# Patient Record
Sex: Male | Born: 1970 | Race: White | Hispanic: No | Marital: Married | Smoking: Never smoker
Health system: Southern US, Community
[De-identification: ages and names within clinical notes are randomized; demographics above are authoritative.]

## PROBLEM LIST (undated history)

## (undated) DIAGNOSIS — C801 Malignant (primary) neoplasm, unspecified: Secondary | ICD-10-CM

## (undated) DIAGNOSIS — E785 Hyperlipidemia, unspecified: Secondary | ICD-10-CM

## (undated) HISTORY — DX: Malignant (primary) neoplasm, unspecified: C80.1

## (undated) HISTORY — DX: Hyperlipidemia, unspecified: E78.5

---

## 2014-06-25 ENCOUNTER — Ambulatory Visit (HOSPITAL_COMMUNITY): Payer: BLUE CROSS/BLUE SHIELD | Attending: Internal Medicine | Admitting: Cardiology

## 2014-06-25 ENCOUNTER — Other Ambulatory Visit (HOSPITAL_COMMUNITY): Payer: Self-pay | Admitting: Internal Medicine

## 2014-06-25 DIAGNOSIS — I493 Ventricular premature depolarization: Secondary | ICD-10-CM

## 2014-06-25 DIAGNOSIS — R9431 Abnormal electrocardiogram [ECG] [EKG]: Secondary | ICD-10-CM

## 2014-06-25 NOTE — Progress Notes (Signed)
Echo performed. 

## 2014-06-29 ENCOUNTER — Telehealth: Payer: Self-pay | Admitting: *Deleted

## 2014-06-29 DIAGNOSIS — I493 Ventricular premature depolarization: Secondary | ICD-10-CM

## 2014-06-29 NOTE — Telephone Encounter (Signed)
Order placed for 24 hour Holter Monitor (Dr. Fransico Him) for 07/06/14 @ 0830.

## 2014-07-06 ENCOUNTER — Encounter (INDEPENDENT_AMBULATORY_CARE_PROVIDER_SITE_OTHER): Payer: BLUE CROSS/BLUE SHIELD

## 2014-07-06 ENCOUNTER — Encounter: Payer: Self-pay | Admitting: Radiology

## 2014-07-06 DIAGNOSIS — I493 Ventricular premature depolarization: Secondary | ICD-10-CM | POA: Diagnosis not present

## 2014-07-06 NOTE — Progress Notes (Signed)
Patient ID: Terry Nichols, male   DOB: 1970/09/24, 44 y.o.   MRN: [id number] E cardio 24hr holter applied

## 2016-04-15 ENCOUNTER — Other Ambulatory Visit: Payer: Self-pay | Admitting: Sports Medicine

## 2016-04-15 DIAGNOSIS — M25511 Pain in right shoulder: Secondary | ICD-10-CM

## 2016-04-23 ENCOUNTER — Ambulatory Visit
Admission: RE | Admit: 2016-04-23 | Discharge: 2016-04-23 | Disposition: A | Discharge status: Home / Self Care | Payer: 59 | Source: Ambulatory Visit | Attending: Sports Medicine | Admitting: Sports Medicine

## 2016-04-23 DIAGNOSIS — M25511 Pain in right shoulder: Secondary | ICD-10-CM

## 2016-06-25 HISTORY — PX: OTHER SURGICAL HISTORY: SHX169

## 2017-08-26 ENCOUNTER — Ambulatory Visit: Payer: 59 | Admitting: Sports Medicine

## 2017-08-30 ENCOUNTER — Encounter: Payer: Self-pay | Admitting: Family Medicine

## 2017-08-30 ENCOUNTER — Ambulatory Visit: Payer: Self-pay

## 2017-08-30 ENCOUNTER — Ambulatory Visit: Payer: Managed Care, Other (non HMO) | Admitting: Family Medicine

## 2017-08-30 ENCOUNTER — Ambulatory Visit: Payer: Self-pay | Admitting: Sports Medicine

## 2017-08-30 VITALS — BP 141/74 | HR 67 | Wt 211.0 lb

## 2017-08-30 DIAGNOSIS — M25561 Pain in right knee: Secondary | ICD-10-CM | POA: Insufficient documentation

## 2017-08-30 MED ORDER — NITROGLYCERIN 0.2 MG/HR TD PT24: MEDICATED_PATCH | TRANSDERMAL | 11 refills | Status: DC

## 2017-08-30 NOTE — Progress Notes (Signed)
Terry Nichols - 47 y.o. male MRN [id number]  Date of birth: [birth year]  SUBJECTIVE:  Including CC & ROS.  Chief Complaint  Patient presents with  . Knee Pain    Terry Nichols is a 47 y.o. male that is presenting with right knee pain. Pain has been ongoing for six months. Pain is located medial aspect. He feels a tightness. Pain is described as a sharp pain with activity. Pain is mild to severe upon flexion. Denies swelling. He plays tennis once a week and basketball and he has had to limit his activity. He had an injection previously in January with no improvement.   Review of Systems  Constitutional: Negative for fever.  HENT: Negative for congestion.   Respiratory: Negative for cough.   Cardiovascular: Negative for chest pain.  Gastrointestinal: Negative for abdominal pain.  Musculoskeletal: Negative for gait problem.  Skin: Negative for color change.  Neurological: Negative for weakness.  Hematological: Negative for adenopathy.  Psychiatric/Behavioral: Negative for agitation.    HISTORY: Past Medical, Surgical, Social, and Family History Reviewed & Updated per EMR.   Pertinent Historical Findings include:  History reviewed. No pertinent past medical history.  Past Surgical History:  Procedure Laterality Date  . Right shoulder surgery Right 06/2016    No Known Allergies  History reviewed. No pertinent family history.   Social History   Socioeconomic History  . Marital status: Married    Spouse name: Not on file  . Number of children: Not on file  . Years of education: Not on file  . Highest education level: Not on file  Occupational History  . Not on file  Social Needs  . Financial resource strain: Not on file  . Food insecurity:    Worry: Not on file    Inability: Not on file  . Transportation needs:    Medical: Not on file    Non-medical: Not on file  Tobacco Use  . Smoking status: Never Smoker  . Smokeless tobacco: Never Used  Substance and Sexual  Activity  . Alcohol use: Never    Frequency: Never  . Drug use: Never  . Sexual activity: Not on file  Lifestyle  . Physical activity:    Days per week: Not on file    Minutes per session: Not on file  . Stress: Not on file  Relationships  . Social connections:    Talks on phone: Not on file    Gets together: Not on file    Attends religious service: Not on file    Active member of club or organization: Not on file    Attends meetings of clubs or organizations: Not on file    Relationship status: Not on file  . Intimate partner violence:    Fear of current or ex partner: Not on file    Emotionally abused: Not on file    Physically abused: Not on file    Forced sexual activity: Not on file  Other Topics Concern  . Not on file  Social History Narrative  . Not on file     PHYSICAL EXAM:  VS: BP (!) 141/74 (BP Location: Left Arm, Patient Position: Sitting, Cuff Size: Normal)   Pulse 67   Wt 211 lb (95.7 kg)   SpO2 97%  Physical Exam Gen: NAD, alert, cooperative with exam, well-appearing ENT: normal lips, normal nasal mucosa,  Eye: normal EOM, normal conjunctiva and lids CV:  no edema, +2 pedal pulses   Resp: no accessory muscle use,  non-labored,  Skin: no rashes, no areas of induration  Neuro: normal tone, normal sensation to touch Psych:  normal insight, alert and oriented MSK:  Right Knee: Normal to inspection with no erythema or effusion or obvious bony abnormalities. Palpation normal with no warmth, joint line tenderness, patellar tenderness, or condyle tenderness. ROM full in flexion and extension and lower leg rotation. Ligaments with solid consistent endpoints including LCL, MCL. Mild pain with [place]ss[place] tests. Non painful patellar compression. Patellar glide without crepitus. Patellar and quadriceps tendons unremarkable. Hamstring and quadriceps strength is normal.  Neurovascularly intact   Limited ultrasound: right knee:  No effusion in SPP  Normal  appearing QT and PT  Outpouching of medial meniscus Normal appearing lateral meniscus   Summary: degenerative meniscal changes of medial meniscus   Ultrasound and interpretation by Clearance Coots, MD    ASSESSMENT & PLAN:   Acute pain of right knee Pain likely to irritation of the medial meniscus. No effusion.  - nitro patches  - compression and ice  - counseled on exercise  - if no improvement consider PT vs injection (CSI vs PRP)

## 2017-08-30 NOTE — Assessment & Plan Note (Signed)
Pain likely to irritation of the medial meniscus. No effusion.  - nitro patches  - compression and ice  - counseled on exercise  - if no improvement consider PT vs injection (CSI vs PRP)

## 2017-08-30 NOTE — Patient Instructions (Signed)
Nitroglycerin Protocol   Apply 1/4 nitroglycerin patch to affected area daily.  Change position of patch within the affected area every 24 hours.  You may experience a headache during the first 1-2 weeks of using the patch, these should subside.  If you experience headaches after beginning nitroglycerin patch treatment, you may take your preferred over the counter pain reliever.  Another side effect of the nitroglycerin patch is skin irritation or rash related to patch adhesive.  Please notify our office if you develop more severe headaches or rash, and stop the patch.  Tendon healing with nitroglycerin patch may require 12 to 24 weeks depending on the extent of injury.  Men should not use if taking Viagra, Cialis, or Levitra.   Do not use if you have migraines or rosacea.   Please try to ice the knee and use tylenol  Follow up with me in 4 weeks if your symptoms have not improved.

## 2017-11-30 ENCOUNTER — Other Ambulatory Visit: Payer: Self-pay | Admitting: Orthopedic Surgery

## 2017-11-30 DIAGNOSIS — M25561 Pain in right knee: Secondary | ICD-10-CM

## 2017-12-03 ENCOUNTER — Ambulatory Visit
Admission: RE | Admit: 2017-12-03 | Discharge: 2017-12-03 | Disposition: A | Discharge status: Home / Self Care | Payer: Managed Care, Other (non HMO) | Source: Ambulatory Visit | Attending: Orthopedic Surgery | Admitting: Orthopedic Surgery

## 2017-12-03 DIAGNOSIS — M25561 Pain in right knee: Secondary | ICD-10-CM

## 2018-02-22 ENCOUNTER — Other Ambulatory Visit: Payer: Self-pay | Admitting: Internal Medicine

## 2018-02-22 DIAGNOSIS — E785 Hyperlipidemia, unspecified: Secondary | ICD-10-CM

## 2019-03-02 ENCOUNTER — Other Ambulatory Visit: Payer: Self-pay | Admitting: Internal Medicine

## 2019-03-02 DIAGNOSIS — R9431 Abnormal electrocardiogram [ECG] [EKG]: Secondary | ICD-10-CM

## 2019-03-02 DIAGNOSIS — E785 Hyperlipidemia, unspecified: Secondary | ICD-10-CM

## 2019-03-16 ENCOUNTER — Ambulatory Visit
Admission: RE | Admit: 2019-03-16 | Discharge: 2019-03-16 | Disposition: A | Discharge status: Home / Self Care | Payer: Self-pay | Source: Ambulatory Visit | Attending: Internal Medicine | Admitting: Internal Medicine

## 2019-03-16 DIAGNOSIS — R9431 Abnormal electrocardiogram [ECG] [EKG]: Secondary | ICD-10-CM

## 2019-03-16 DIAGNOSIS — E785 Hyperlipidemia, unspecified: Secondary | ICD-10-CM

## 2019-05-07 IMAGING — MR MR KNEE*R* W/O CM
4 of 7 series · 19 of 40 positions shown · non-contrast
Comparison: None.

CLINICAL DATA: Right knee pain for 8 months.  No known injury.

EXAM:
MRI OF THE RIGHT KNEE WITHOUT CONTRAST
TECHNIQUE: Multiplanar, multisequence MR imaging of the knee was performed. No
intravenous contrast was administered.

[Series 3: T2 fat-sat · axial · 4.0mm · 0.33mm/px · z∈[-30,+75]mm · 4 of 30 slices shown]
[im 1/30]
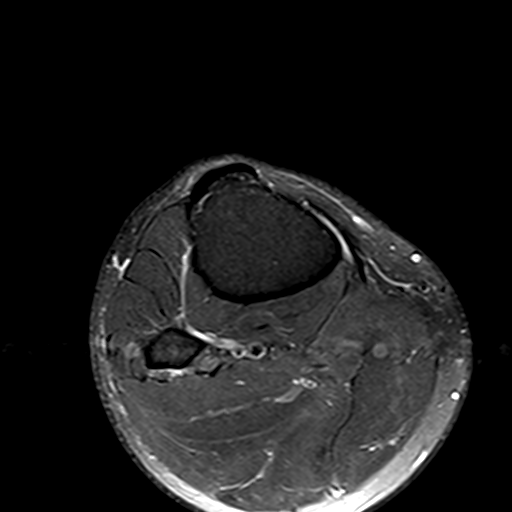
[im 5/30]
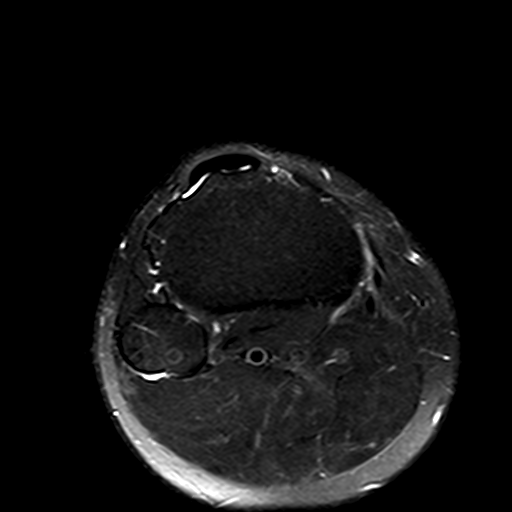
[im 15/30]
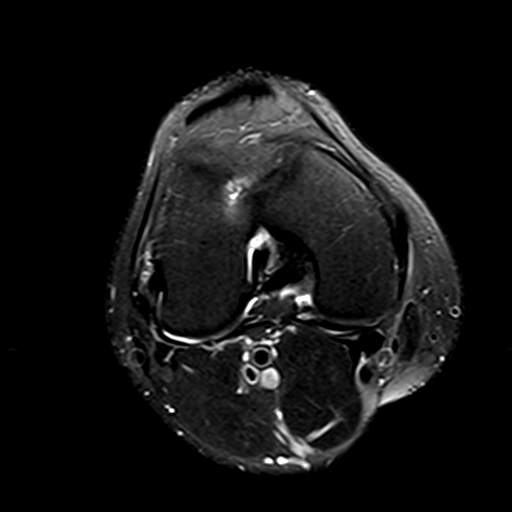
[im 25/30]
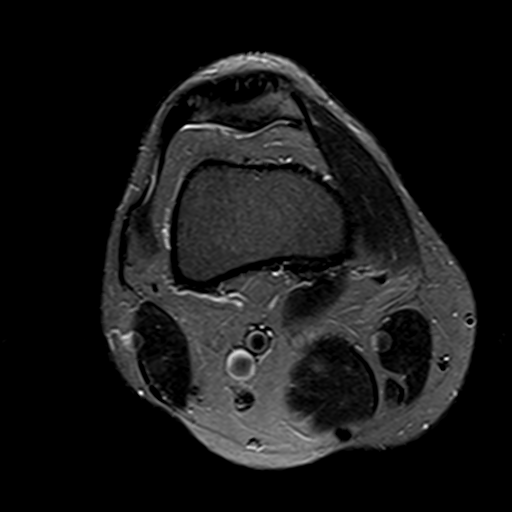

[Series 4: PD fat-sat · sagittal · 3.0mm · 0.29mm/px · 6 of 30 slices shown (1 of 3)]
[im 1/30]
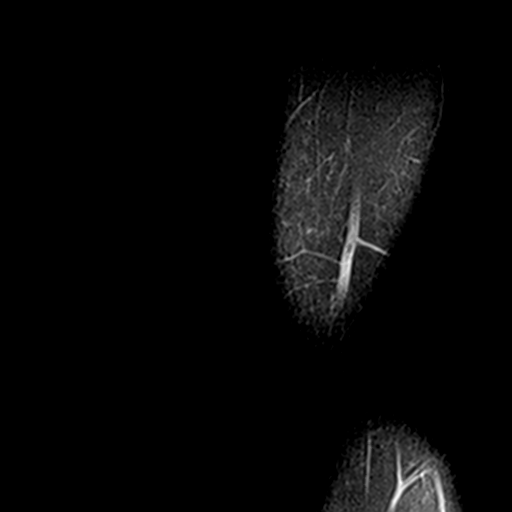
[im 6/30]
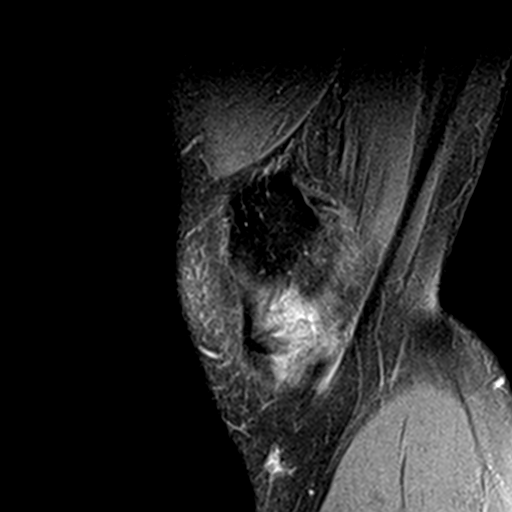
[im 12/30]
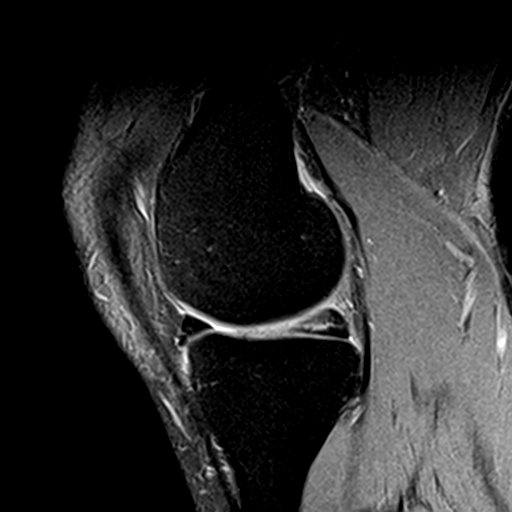
[im 18/30]
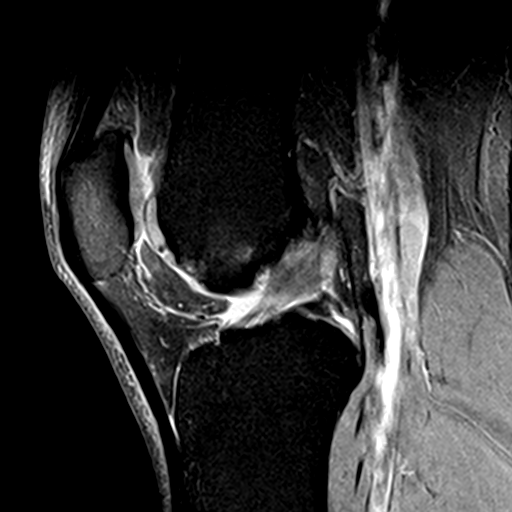
[im 24/30]
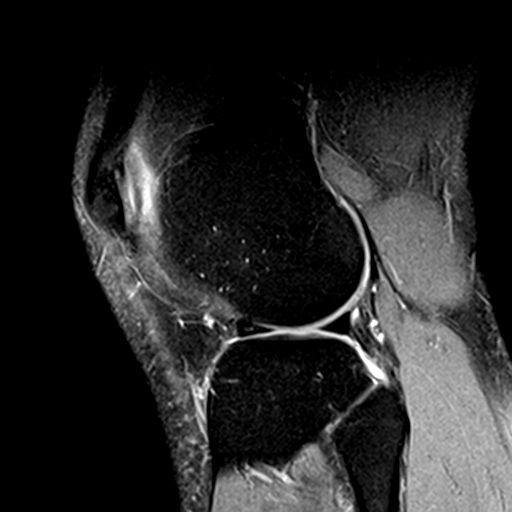
[im 30/30]
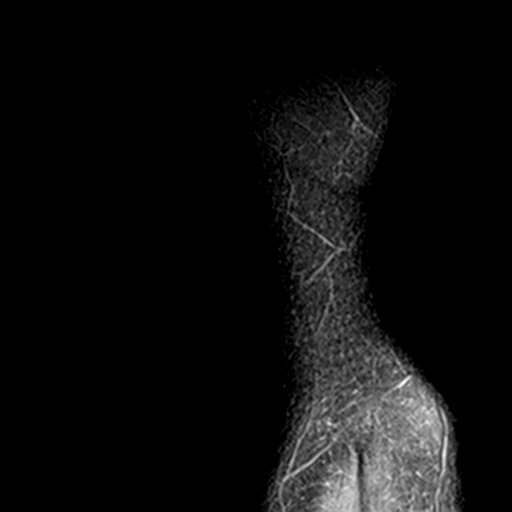

[Series 7: PD fat-sat · coronal · 4.0mm · 0.29mm/px · 6 of 32 slices shown (2 of 3)]
[im 1/32]
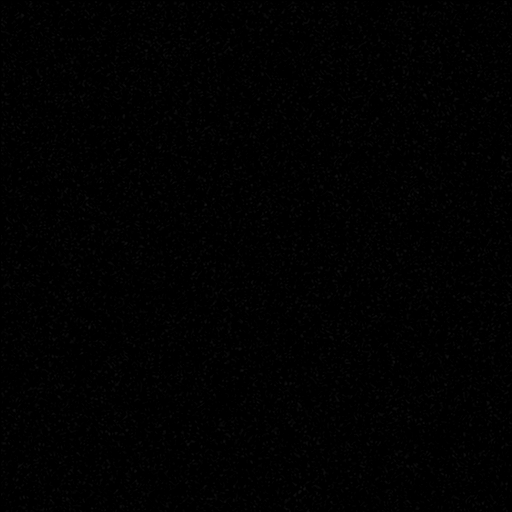
[im 7/32]
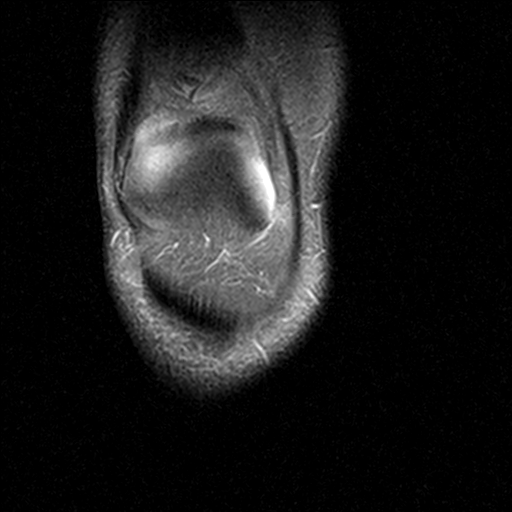
[im 13/32]
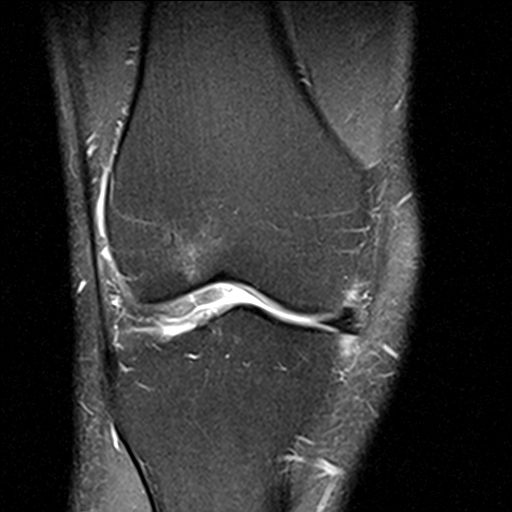
[im 19/32]
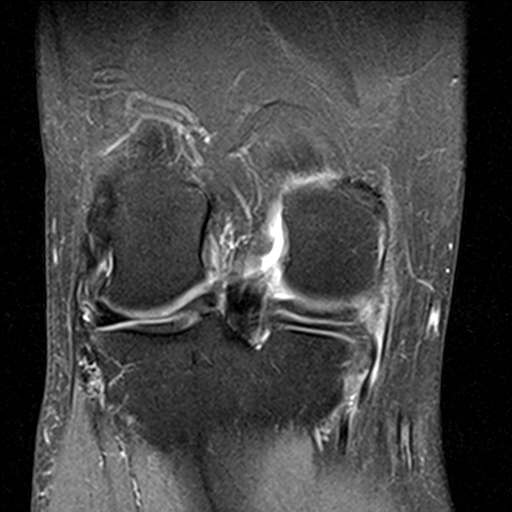
[im 25/32]
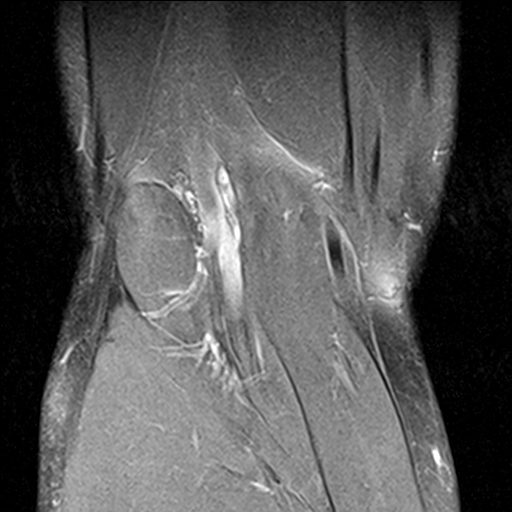
[im 32/32]
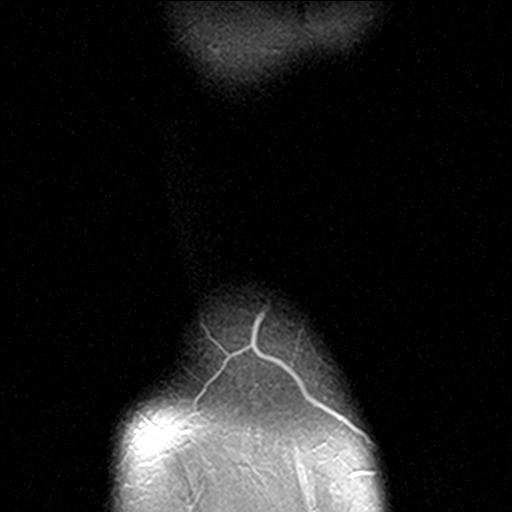

[Series 8: PD fat-sat · oblique · 2.5mm · 0.29mm/px · 3 of 14 slices shown (3 of 3)]
[im 1/14]
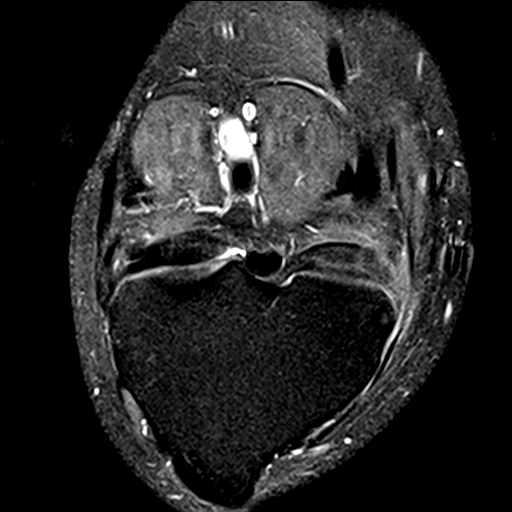
[im 7/14]
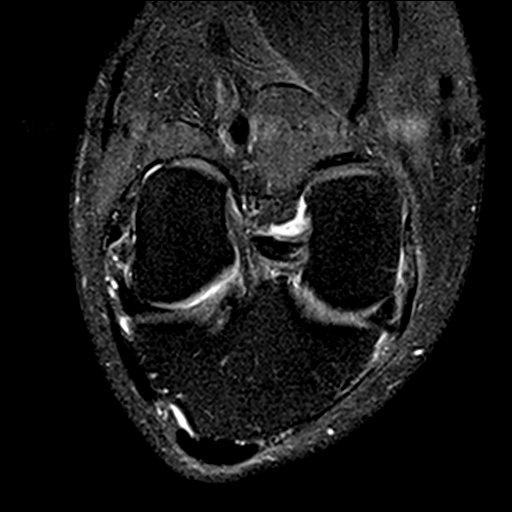
[im 14/14]
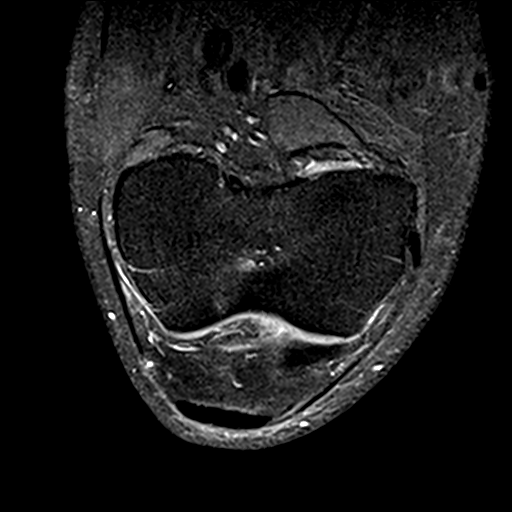

[19 of 40 positions shown; findings below may reference images not displayed]

FINDINGS: MENISCI

Medial meniscus: Complex tear of the posterior horn-body junction of
the medial meniscus extending into the posterior horn.

Lateral meniscus:  Intact.

LIGAMENTS

Cruciates:  Intact ACL and PCL.

Collaterals: Medial collateral ligament is intact. Lateral
collateral ligament complex is intact.

CARTILAGE

Patellofemoral: Focal 8 mm chondral defect involving the lateral
trochlea with subchondral cystic changes and marrow edema.

Medial: Partial-thickness cartilage loss of the medial femorotibial
compartment.

Lateral:  No chondral defect.

Joint: No joint effusion. Normal Hoffa's fat. No plical thickening.

Popliteal Fossa:  No Baker cyst. Intact popliteus tendon.

Extensor Mechanism: Intact quadriceps tendon. Intact patellar
tendon. Intact medial patellar retinaculum. Intact lateral patellar
retinaculum. Intact MPFL.

Bones: No other focal marrow signal abnormality. No fracture or
dislocation.

Other: No fluid collection or hematoma.  Normal muscles.
IMPRESSION: 1. Complex tear of the posterior horn-body junction of the medial
meniscus extending into the posterior horn.
2. Focal 8 mm chondral defect involving the lateral trochlea with
subchondral cystic changes and marrow edema.
3. Partial-thickness cartilage loss of the medial femorotibial
compartment.

## 2020-02-28 ENCOUNTER — Telehealth (HOSPITAL_COMMUNITY): Payer: Self-pay

## 2020-02-28 NOTE — Telephone Encounter (Signed)
Called to Discuss with patient about Covid symptoms and the use of the monoclonal antibody infusion for those with mild to moderate Covid symptoms and at a high risk of hospitalization.     Pt appears to qualify for this infusion due to co-morbid conditions and/or a member of an at-risk group in accordance with the FDA Emergency Use Authorization.    Unable to reach pt, left voicemail requesting call back to hotline number.   Mandrell Vangilder Lorita Officer, RN

## 2020-06-17 ENCOUNTER — Encounter: Payer: Self-pay | Admitting: Gastroenterology

## 2020-06-21 ENCOUNTER — Ambulatory Visit (AMBULATORY_SURGERY_CENTER): Payer: Managed Care, Other (non HMO)

## 2020-06-21 ENCOUNTER — Other Ambulatory Visit: Payer: Self-pay

## 2020-06-21 VITALS — Ht 74.0 in | Wt 200.0 lb

## 2020-06-21 DIAGNOSIS — Z1211 Encounter for screening for malignant neoplasm of colon: Secondary | ICD-10-CM

## 2020-06-21 MED ORDER — NA SULFATE-K SULFATE-MG SULF 17.5-3.13-1.6 GM/177ML PO SOLN: 1.0000 | Freq: Once | ORAL | 0 refills | Status: AC

## 2020-06-21 NOTE — Progress Notes (Signed)
No egg or soy allergy known to patient  No issues with past sedation with any surgeries or procedures No intubation problems in the past  No FH of Malignant Hyperthermia No diet pills per patient No home 02 use per patient  No blood thinners per patient  Pt denies issues with constipation  No A fib or A flutter  EMMI video to pt or via [place] 19 guidelines implemented in PV today with Pt and RN  Pt is fully vaccinated  for [employer] given to pt in PV today , Code to Pharmacy and  NO PA's for preps discussed with pt In PV today  Discussed with pt there will be an out-of-pocket cost for prep and that varies from $0 to 70 dollars   Due to the COVID-19 pandemic we are asking patients to follow certain guidelines.  Pt aware of COVID protocols and [facility] guidelines   Virtual PV completed.  Prep instructions sent to [facility] as well as paper copy mailed.

## 2020-06-24 ENCOUNTER — Encounter: Payer: Self-pay | Admitting: Gastroenterology

## 2020-07-05 ENCOUNTER — Encounter: Payer: Self-pay | Admitting: Gastroenterology

## 2020-07-05 ENCOUNTER — Other Ambulatory Visit: Payer: Self-pay

## 2020-07-05 ENCOUNTER — Ambulatory Visit (AMBULATORY_SURGERY_CENTER): Payer: Managed Care, Other (non HMO) | Admitting: Gastroenterology

## 2020-07-05 VITALS — BP 129/61 | HR 60 | Temp 97.1°F | Resp 14 | Ht 74.0 in | Wt 200.0 lb

## 2020-07-05 DIAGNOSIS — D127 Benign neoplasm of rectosigmoid junction: Secondary | ICD-10-CM | POA: Diagnosis not present

## 2020-07-05 DIAGNOSIS — D123 Benign neoplasm of transverse colon: Secondary | ICD-10-CM

## 2020-07-05 DIAGNOSIS — Z1211 Encounter for screening for malignant neoplasm of colon: Secondary | ICD-10-CM

## 2020-07-05 MED ORDER — SODIUM CHLORIDE 0.9 % IV SOLN
500.0000 mL | Freq: Once | INTRAVENOUS | Status: AC
Start: 2020-07-05 — End: ?

## 2020-07-05 NOTE — Progress Notes (Signed)
Vitals-CW  Pt's states no medical or surgical changes since previsit or office visit. 

## 2020-07-05 NOTE — Progress Notes (Signed)
pt tolerated well. VSS. awake and to recovery. Report given to RN.  

## 2020-07-05 NOTE — Op Note (Signed)
[facility] Patient Name: Terry Nichols Procedure Date: 07/05/2020 9:55 AM MRN: [id number] Endoscopist: Justice Britain , MD Age: 50 Referring MD:  Date of Birth: 12-Oct-1970 Gender: Male Account #: [id number] Procedure:                Colonoscopy Indications:              Screening for colorectal malignant neoplasm, This                            is the patient's first colonoscopy Medicines:                Monitored Anesthesia Care Procedure:                Pre-Anesthesia Assessment:                           - Prior to the procedure, a History and Physical                            was performed, and patient medications and                            allergies were reviewed. The patient's tolerance of                            previous anesthesia was also reviewed. The risks                            and benefits of the procedure and the sedation                            options and risks were discussed with the patient.                            All questions were answered, and informed consent                            was obtained. Prior Anticoagulants: The patient has                            taken no previous anticoagulant or antiplatelet                            agents. ASA Grade Assessment: II - A patient with                            mild systemic disease. After reviewing the risks                            and benefits, the patient was deemed in                            satisfactory condition to undergo the procedure.  After obtaining informed consent, the colonoscope                            was passed under direct vision. Throughout the                            procedure, the patient's blood pressure, pulse, and                            oxygen saturations were monitored continuously. The                            Olympus CF-HQ190L [id number]) Colonoscope was                            introduced through the anus  and advanced to the the                            rectum but could not traverse the region. The                            colonoscopy was unusually difficult due to                            restricted mobility of the colon. Successful                            completion of the procedure was aided by changing                            the patient to a prone position, using manual                            pressure, withdrawing the scope and eventually                            replacing with the adult colonoscope with a                            pediatric colonoscope, straightening and shortening                            the scope to obtain bowel loop reduction and using                            scope torsion. The patient tolerated the procedure.                            The quality of the bowel preparation was good. The                            terminal ileum, ileocecal valve, appendiceal  orifice, and rectum were photographed. The Olympus                            [id number]-[id number] (#[id number]) Colonoscope was introduced                            through the anus and advanced to the 5 cm into the                            ileum. Scope In: 10:00:43 AM Scope Out: 10:28:43 AM Scope Withdrawal Time: 0 hours 13 minutes 9 seconds  Total Procedure Duration: 0 hours 28 minutes 0 seconds  Findings:                 The digital rectal exam findings include                            hemorrhoids. Pertinent negatives include no                            palpable rectal lesions.                           The proximal rectum, recto-sigmoid colon and                            sigmoid colon were significantly tortuous.                            Advancing the scope required prone positioning and                            pressure and withdrawing the scope and replacing                            with the pediatric colonoscope with maneuvers also                             discussed above.                           The terminal ileum and ileocecal valve appeared                            normal.                           Two sessile polyps were found in the recto-sigmoid                            colon. The polyps were 3 to 4 mm in size. These                            polyps were removed with a cold snare. Resection  and retrieval were complete.                           Multiple small-mouthed diverticula were found in                            the recto-sigmoid colon and sigmoid colon - this is                            in the region of the significant tortuosity noted.                           Normal mucosa was found in the entire colon                            otherwise.                           Non-bleeding non-thrombosed internal hemorrhoids                            were found during retroflexion, during perianal                            exam and during digital exam. The hemorrhoids were                            Grade II (internal hemorrhoids that prolapse but                            reduce spontaneously). Complications:            No immediate complications. Estimated Blood Loss:     Estimated blood loss was minimal. Impression:               - Hemorrhoids found on digital rectal exam.                           - Tortuous colon in the proximal                            rectum/rectosigmoid with diverticulosis noted in                            this region.                           - The examined portion of the ileum was normal.                           - Two 3 to 4 mm polyps at the recto-sigmoid colon,                            removed with a cold snare. Resected and retrieved.                           -  Diverticulosis in the recto-sigmoid colon and in                            the sigmoid colon.                           - Normal mucosa in the entire examined colon                             otherwise.                           - Non-bleeding non-thrombosed internal hemorrhoids. Recommendation:           - The patient will be observed post-procedure,                            until all discharge criteria are met.                           - Discharge patient to home.                           - Patient has a contact number available for                            emergencies. The signs and symptoms of potential                            delayed complications were discussed with the                            patient. Return to normal activities tomorrow.                            Written discharge instructions were provided to the                            patient.                           - High fiber diet.                           - Use FiberCon 1-2 tablets PO daily.                           - Continue present medications.                           - Await pathology results.                           - Repeat colonoscopy in 08/31/08 years for                            surveillance based on pathology results.                           -  The findings and recommendations were discussed                            with the patient.                           - The findings and recommendations were discussed                            with the patient's family. Justice Britain, MD 07/05/2020 10:42:35 AM

## 2020-07-05 NOTE — Progress Notes (Signed)
Called to room to assist during endoscopic procedure.  Patient ID and intended procedure confirmed with present staff. Received instructions for my participation in the procedure from the performing physician.  

## 2020-07-05 NOTE — Patient Instructions (Signed)
Start high fiber diet, take fibercon 1-2 tablets daily. Continue present medications Repeat colonoscopy based on pathology results  YOU HAD AN ENDOSCOPIC PROCEDURE TODAY AT [facility]:   Refer to the procedure report that was given to you for any specific questions about what was found during the examination.  If the procedure report does not answer your questions, please call your gastroenterologist to clarify.  If you requested that your care partner not be given the details of your procedure findings, then the procedure report has been included in a sealed envelope for you to review at your convenience later.  YOU SHOULD EXPECT: Some feelings of bloating in the abdomen. Passage of more gas than usual.  Walking can help get rid of the air that was put into your GI tract during the procedure and reduce the bloating. If you had a lower endoscopy (such as a colonoscopy or flexible sigmoidoscopy) you may notice spotting of blood in your stool or on the toilet paper. If you underwent a bowel prep for your procedure, you may not have a normal bowel movement for a few days.  Please Note:  You might notice some irritation and congestion in your nose or some drainage.  This is from the oxygen used during your procedure.  There is no need for concern and it should clear up in a day or so.  SYMPTOMS TO REPORT IMMEDIATELY:   Following lower endoscopy (colonoscopy or flexible sigmoidoscopy):  Excessive amounts of blood in the stool  Significant tenderness or worsening of abdominal pains  Swelling of the abdomen that is new, acute  Fever of 100F or higher  For urgent or emergent issues, a gastroenterologist can be reached at any hour by calling [phone]. Do not use MyChart messaging for urgent concerns.    DIET:  We do recommend a small meal at first, but then you may proceed to your regular diet.  Drink plenty of fluids but you should avoid alcoholic beverages for 24  hours.  ACTIVITY:  You should plan to take it easy for the rest of today and you should NOT DRIVE or use heavy machinery until tomorrow (because of the sedation medicines used during the test).    FOLLOW UP: Our staff will call the number listed on your records 48-72 hours following your procedure to check on you and address any questions or concerns that you may have regarding the information given to you following your procedure. If we do not reach you, we will leave a message.  We will attempt to reach you two times.  During this call, we will ask if you have developed any symptoms of COVID 19. If you develop any symptoms (ie: fever, flu-like symptoms, shortness of breath, cough etc.) before then, please call [phone].  If you test positive for Covid 19 in the 2 weeks post procedure, please call and report this information to [place].    If any biopsies were taken you will be contacted by phone or by letter within the next 1-3 weeks.  Please call us at [phone] if you have not heard about the biopsies in 3 weeks.    SIGNATURES/CONFIDENTIALITY: You and/or your care partner have signed paperwork which will be entered into your electronic medical record.  These signatures attest to the fact that that the information above on your After Visit Summary has been reviewed and is understood.  Full responsibility of the confidentiality of this discharge information lies with you and/or your care-partner.

## 2020-07-10 ENCOUNTER — Telehealth: Payer: Self-pay | Admitting: *Deleted

## 2020-07-10 NOTE — Telephone Encounter (Signed)
  Follow up Call-  Call back number 07/05/2020  Post procedure Call Back phone  # [phone]  Permission to leave phone message Yes  Some recent data might be hidden     Patient questions:  Do you have a fever, pain , or abdominal swelling? No. Pain Score  0 *  Have you tolerated food without any problems? Yes.    Have you been able to return to your normal activities? Yes.    Do you have any questions about your discharge instructions: Diet   No. Medications  No. Follow up visit  No.  Do you have questions or concerns about your Care? No.  Actions: * If pain score is 4 or above: No action needed, pain <4.  1. Have you developed a fever since your procedure? no  2.   Have you had an respiratory symptoms (SOB or cough) since your procedure? no  3.   Have you tested positive for COVID 19 since your procedure no  4.   Have you had any family members/close contacts diagnosed with the COVID 19 since your procedure?  no   If yes to any of these questions please route to Joylene John, RN and Joella Prince, RN

## 2020-07-13 ENCOUNTER — Encounter: Payer: Self-pay | Admitting: Gastroenterology
